# Patient Record
Sex: Male | Born: 1975 | Race: White | Hispanic: No | Smoking: Never smoker
Health system: Southern US, Community
[De-identification: ages and names within clinical notes are randomized; demographics above are authoritative.]

## PROBLEM LIST (undated history)

## (undated) DIAGNOSIS — K219 Gastro-esophageal reflux disease without esophagitis: Secondary | ICD-10-CM

## (undated) DIAGNOSIS — F909 Attention-deficit hyperactivity disorder, unspecified type: Secondary | ICD-10-CM

## (undated) HISTORY — DX: Attention-deficit hyperactivity disorder, unspecified type: F90.9

## (undated) HISTORY — PX: KNEE SURGERY: SHX244

---

## 2010-06-09 ENCOUNTER — Encounter: Admission: RE | Admit: 2010-06-09 | Discharge: 2010-06-09 | Payer: Self-pay | Admitting: Sports Medicine

## 2010-07-14 ENCOUNTER — Encounter: Admission: RE | Admit: 2010-07-14 | Discharge: 2010-07-14 | Payer: Self-pay | Admitting: Sports Medicine

## 2012-07-23 ENCOUNTER — Encounter (HOSPITAL_COMMUNITY): Payer: Self-pay

## 2012-07-23 ENCOUNTER — Emergency Department (HOSPITAL_COMMUNITY)
Admission: EM | Admit: 2012-07-23 | Discharge: 2012-07-23 | Disposition: A | Discharge status: Home / Self Care | Payer: BC Managed Care – PPO | Attending: Emergency Medicine | Admitting: Emergency Medicine

## 2012-07-23 ENCOUNTER — Emergency Department (HOSPITAL_COMMUNITY): Payer: BC Managed Care – PPO

## 2012-07-23 DIAGNOSIS — R079 Chest pain, unspecified: Secondary | ICD-10-CM | POA: Insufficient documentation

## 2012-07-23 DIAGNOSIS — K219 Gastro-esophageal reflux disease without esophagitis: Secondary | ICD-10-CM | POA: Insufficient documentation

## 2012-07-23 HISTORY — DX: Gastro-esophageal reflux disease without esophagitis: K21.9

## 2012-07-23 LAB — CBC
Hemoglobin: 15.2 g/dL (ref 13.0–17.0)
MCH: 30.7 pg (ref 26.0–34.0)
MCV: 89.3 fL (ref 78.0–100.0)
RBC: 4.95 MIL/uL (ref 4.22–5.81)

## 2012-07-23 LAB — BASIC METABOLIC PANEL
CO2: 26 mEq/L (ref 19–32)
Calcium: 9.6 mg/dL (ref 8.4–10.5)
Glucose, Bld: 106 mg/dL — ABNORMAL HIGH (ref 70–99)
Sodium: 137 mEq/L (ref 135–145)

## 2012-07-23 MED ORDER — NAPROXEN 500 MG PO TABS: 500.0000 mg | ORAL_TABLET | Freq: Two times a day (BID) | ORAL | Status: DC

## 2012-07-23 MED ORDER — CYCLOBENZAPRINE HCL 10 MG PO TABS: 10.0000 mg | ORAL_TABLET | Freq: Two times a day (BID) | ORAL | Status: DC | PRN

## 2012-07-23 NOTE — ED Provider Notes (Signed)
History     CSN: [id number]  Arrival date & time 07/23/12  0940   First MD Initiated Contact with Patient 07/23/12 [id number]      Chief Complaint  Patient presents with  . Chest Pain    (Consider location/radiation/quality/duration/timing/severity/associated sxs/prior treatment) HPI.... aching sensation left anterior chest since 8:30 this morning with no associated dyspnea, diaphoresis, nausea. Symptoms were minimal. Does not smoke cigarettes. No family history of heart disease. No chronic medical conditions other than GERD. Nothing makes symptoms better or worse.   Past Medical History  Diagnosis Date  . Acid reflux     History reviewed. No pertinent past surgical history.  History reviewed. No pertinent family history.  History  Substance Use Topics  . Smoking status: Never Smoker   . Smokeless tobacco: Not on file  . Alcohol Use: Yes      Review of Systems  All other systems reviewed and are negative.    Allergies  Review of patient's allergies indicates no known allergies.  Home Medications   Current Outpatient Rx  Name Route Sig Dispense Refill  . ASPIRIN 325 MG PO TABS Oral Take 650 mg by mouth once. For headache    . OMEPRAZOLE MAGNESIUM 20 MG PO TBEC Oral Take 20 mg by mouth daily.      BP 118/80  Pulse 76  Temp 97.7 F (36.5 C) (Oral)  Resp 14  SpO2 98%  Physical Exam  Nursing note and vitals reviewed. Constitutional: He is oriented to person, place, and time. He appears well-developed and well-nourished.  HENT:  Head: Normocephalic and atraumatic.  Eyes: Conjunctivae normal and EOM are normal. Pupils are equal, round, and reactive to light.  Neck: Normal range of motion. Neck supple.  Cardiovascular: Normal rate, regular rhythm and normal heart sounds.   Pulmonary/Chest: Effort normal and breath sounds normal.  Abdominal: Soft. Bowel sounds are normal.  Musculoskeletal: Normal range of motion.  Neurological: He is alert and oriented to  person, place, and time.  Skin: Skin is warm and dry.  Psychiatric: He has a normal mood and affect.    ED Course  Procedures (including critical care time)  Labs Reviewed  BASIC METABOLIC PANEL - Abnormal; Notable for the following:    Glucose, Bld 106 (*)     All other components within normal limits  CBC  TROPONIN I   Dg Chest 2 View  07/23/2012  *RADIOLOGY REPORT*  Clinical Data: Left upper anterior chest pain  CHEST - 2 VIEW  Comparison: None  Findings: Normal heart size, mediastinal contours, and pulmonary vascularity. Lungs clear. No pleural effusion or pneumothorax. No acute osseous findings.  IMPRESSION: No acute abnormalities.   Original Report Authenticated By: Lollie Marrow, M.D.      No diagnosis found.   Date: 07/23/2012  Rate: 79  Rhythm: normal sinus rhythm  QRS Axis: right  Intervals: normal  ST/T Wave abnormalities: normal  Conduction Disutrbances:incomplete RBBB  Narrative Interpretation:   Old EKG Reviewed: none available   MDM  Extremely low risk for acute coronary syndrome or pulmonary embolus. History is vague. Screening test negative. Discharge home on Flexeril 10 mg #20 and Naprosyn 5 mg # 20. Followup [facility] cardiology         Donnetta Hutching, MD 07/23/12 1327

## 2012-07-23 NOTE — ED Notes (Signed)
Pt d/c home in NAD. Pt voiced understanding of d/c instructions and follow up care.  

## 2012-07-23 NOTE — ED Notes (Signed)
Pt gone to xray

## 2012-07-23 NOTE — ED Notes (Signed)
Pt complains of chest pain, dull in nature, started this am, non radiating

## 2012-07-26 ENCOUNTER — Encounter: Payer: Self-pay | Admitting: Cardiology

## 2012-07-26 ENCOUNTER — Ambulatory Visit (INDEPENDENT_AMBULATORY_CARE_PROVIDER_SITE_OTHER): Payer: BC Managed Care – PPO | Admitting: Cardiology

## 2012-07-26 VITALS — BP 136/95 | HR 73 | Ht 74.0 in | Wt 237.4 lb

## 2012-07-26 DIAGNOSIS — R072 Precordial pain: Secondary | ICD-10-CM | POA: Insufficient documentation

## 2012-07-26 NOTE — Patient Instructions (Addendum)
Follow up as needed

## 2012-07-26 NOTE — Progress Notes (Signed)
HPI The patient presents for evaluation of chest discomfort. He was seen in the emergency room recently. He said he was sitting at work when he developed some discomfort that was midsternal and left of sternum. It felt like a "sprain". It was mild and lasted for about an hour. He did not describe radiation to his neck or to his arms. He did not describe associated nausea vomiting or diaphoresis. He was not short of breath and had no PND or orthopnea. He presented to the emergency room where the EKG was nonacute though there was a right axis deviation and RSR prime in V1. It was felt possibly to be musculoskeletal and he was told however to followup here. Of note he does have reflux but he has had none of those symptoms recently.  No Known Allergies  Current Outpatient Prescriptions  Medication Sig Dispense Refill  . aspirin 325 MG tablet Take 650 mg by mouth once. For headache      . omeprazole (PRILOSEC OTC) 20 MG tablet Take 20 mg by mouth daily.        Past Medical History  Diagnosis Date  . Acid reflux     PSH:  None  Family History:  The patient has no family history of early coronary disease. However, his brother did have some kind of apparent electrophysiology problem treated as a child.  History   Social History  . Marital Status: Married    Spouse Name: N/A    Number of Children: N/A  . Years of Education: N/A   Occupational History  . Not on file.   Social History Main Topics  . Smoking status: Never Smoker   . Smokeless tobacco: Not on file  . Alcohol Use: Yes  . Drug Use: No  . Sexually Active:    Other Topics Concern  . Not on file   Social History Narrative  . No narrative on file    ROS:  As stated in the HPI and negative for all other systems.   PHYSICAL EXAM BP 136/95  Pulse 73  Ht 6\' 2"  (1.88 m)  Wt 237 lb 6.4 oz (107.684 kg)  BMI 30.48 kg/m2 GENERAL:  Well appearing HEENT:  Pupils equal round and reactive, fundi not visualized, oral mucosa  unremarkable NECK:  No jugular venous distention, waveform within normal limits, carotid upstroke brisk and symmetric, no bruits, no thyromegaly LYMPHATICS:  No cervical, inguinal adenopathy LUNGS:  Clear to auscultation bilaterally BACK:  No CVA tenderness CHEST:  Unremarkable HEART:  PMI not displaced or sustained,S1 and S2 within normal limits, no S3, no S4, no clicks, no rubs, no murmurs ABD:  Flat, positive bowel sounds normal in frequency in pitch, no bruits, no rebound, no guarding, no midline pulsatile mass, no hepatomegaly, no splenomegaly EXT:  2 plus pulses throughout, no edema, no cyanosis no clubbing SKIN:  No rashes no nodules NEURO:  Cranial nerves II through XII grossly intact, motor grossly intact throughout PSYCH:  Cognitively intact, oriented to person place and time  EKG:  Sinus rhythm, rate 79, sinus arrhythmia, rightward axis, RSR prime V1 no acute ST-T wave changes.  ASSESSMENT AND PLAN  Chest pain - His chest pain is atypical. He has no risk factors and a normal exam. The pretest probability of obstructive coronary disease is extremely low. No further evaluation from a cardiovascular standpoint is warranted.  Overweight - The patient's body mass index with him in the obese range though this is clearly an exaggeration as he  is fit. We had a long discussion about exercise fitness and weight goals.

## 2014-03-21 ENCOUNTER — Ambulatory Visit (INDEPENDENT_AMBULATORY_CARE_PROVIDER_SITE_OTHER): Payer: BC Managed Care – PPO | Admitting: Emergency Medicine

## 2014-03-21 ENCOUNTER — Ambulatory Visit (INDEPENDENT_AMBULATORY_CARE_PROVIDER_SITE_OTHER): Payer: BC Managed Care – PPO

## 2014-03-21 VITALS — BP 132/82 | HR 68 | Temp 98.4°F | Resp 20 | Ht 74.5 in | Wt 235.0 lb

## 2014-03-21 DIAGNOSIS — R109 Unspecified abdominal pain: Secondary | ICD-10-CM

## 2014-03-21 DIAGNOSIS — K5900 Constipation, unspecified: Secondary | ICD-10-CM

## 2014-03-21 LAB — POCT CBC
GRANULOCYTE PERCENT: 58 % (ref 37–80)
HEMATOCRIT: 46.2 % (ref 43.5–53.7)
HEMOGLOBIN: 15.3 g/dL (ref 14.1–18.1)
LYMPH, POC: 2.1 (ref 0.6–3.4)
MCH, POC: 30.7 pg (ref 27–31.2)
MCHC: 33.1 g/dL (ref 31.8–35.4)
MCV: 92.6 fL (ref 80–97)
MID (cbc): 0.5 (ref 0–0.9)
MPV: 9.7 fL (ref 0–99.8)
POC GRANULOCYTE: 3.5 (ref 2–6.9)
POC LYMPH PERCENT: 33.8 %L (ref 10–50)
POC MID %: 8.2 %M (ref 0–12)
Platelet Count, POC: 303 10*3/uL (ref 142–424)
RBC: 4.99 M/uL (ref 4.69–6.13)
RDW, POC: 13.4 %
WBC: 6.1 10*3/uL (ref 4.6–10.2)

## 2014-03-21 LAB — POCT URINALYSIS DIPSTICK
BILIRUBIN UA: NEGATIVE
Blood, UA: NEGATIVE
Glucose, UA: NEGATIVE
KETONES UA: NEGATIVE
LEUKOCYTES UA: NEGATIVE
Nitrite, UA: NEGATIVE
Protein, UA: NEGATIVE
SPEC GRAV UA: 1.015
Urobilinogen, UA: 0.2
pH, UA: 5.5

## 2014-03-21 MED ORDER — DICYCLOMINE HCL 20 MG PO TABS: 20.0000 mg | ORAL_TABLET | Freq: Three times a day (TID) | ORAL | Status: DC

## 2014-03-21 NOTE — Patient Instructions (Signed)
Constipation  Constipation is when a person has fewer than three bowel movements a week, has difficulty having a bowel movement, or has stools that are dry, hard, or larger than normal. As people grow older, constipation is more common. If you try to fix constipation with medicines that make you have a bowel movement (laxatives), the problem may get worse. Long-term laxative use may cause the muscles of the colon to become weak. A low-fiber diet, not taking in enough fluids, and taking certain medicines may make constipation worse.   CAUSES   · Certain medicines, such as antidepressants, pain medicine, iron supplements, antacids, and water pills.    · Certain diseases, such as diabetes, irritable bowel syndrome (IBS), thyroid disease, or depression.    · Not drinking enough water.    · Not eating enough fiber-rich foods.    · Stress or travel.    · Lack of physical activity or exercise.    · Ignoring the urge to have a bowel movement.    · Using laxatives too much.    SIGNS AND SYMPTOMS   · Having fewer than three bowel movements a week.    · Straining to have a bowel movement.    · Having stools that are hard, dry, or larger than normal.    · Feeling full or bloated.    · Pain in the lower abdomen.    · Not feeling relief after having a bowel movement.    DIAGNOSIS   Your health care Christean Silvestri will take a medical history and perform a physical exam. Further testing may be done for severe constipation. Some tests may include:  · A barium enema X-ray to examine your rectum, colon, and, sometimes, your small intestine.    · A sigmoidoscopy to examine your lower colon.    · A colonoscopy to examine your entire colon.  TREATMENT   Treatment will depend on the severity of your constipation and what is causing it. Some dietary treatments include drinking more fluids and eating more fiber-rich foods. Lifestyle treatments may include regular exercise. If these diet and lifestyle recommendations do not help, your health care  Llana Deshazo may recommend taking over-the-counter laxative medicines to help you have bowel movements. Prescription medicines may be prescribed if over-the-counter medicines do not work.   HOME CARE INSTRUCTIONS   · Eat foods that have a lot of fiber, such as fruits, vegetables, whole grains, and beans.  · Limit foods high in fat and processed sugars, such as french fries, hamburgers, cookies, candies, and soda.    · A fiber supplement may be added to your diet if you cannot get enough fiber from foods.    · Drink enough fluids to keep your urine clear or pale yellow.    · Exercise regularly or as directed by your health care Kateryna Grantham.    · Go to the restroom when you have the urge to go. Do not hold it.    · Only take over-the-counter or prescription medicines as directed by your health care Neria Procter. Do not take other medicines for constipation without talking to your health care Nikyah Lackman first.    SEEK IMMEDIATE MEDICAL CARE IF:   · You have bright red blood in your stool.    · Your constipation lasts for more than 4 days or gets worse.    · You have abdominal or rectal pain.    · You have thin, pencil-like stools.    · You have unexplained weight loss.  MAKE SURE YOU:   · Understand these instructions.  · Will watch your condition.  · Will get help right away if you are not   doing well or get worse.  Document Released: 06/17/2004 Document Revised: 09/24/2013 Document Reviewed: 07/01/2013  ExitCare® Patient Information ©2015 [employer]. This information is not intended to replace advice given to you by your health care Arby Dahir. Make sure you discuss any questions you have with your health care Amani Nodarse.

## 2014-03-21 NOTE — Progress Notes (Addendum)
This chart was scribed for Russell Chase , MD by Russell Chase, ED Scribe. This patient was seen in room Room/bed 2 and the patient's care was started at 1:09 PM. Subjective:    Patient ID: Russell Chase, male    DOB: 04/21/1976, 38 y.o.   MRN: [id number] Chief Complaint  Patient presents with  . Abdominal Pain  . Back Pain   Abdominal Pain Pertinent negatives include no fever, nausea or vomiting.  Back Pain Associated symptoms include abdominal pain. Pertinent negatives include no fever.   [name] is a 38 y.o. male who [facility] of ongoing generalized abdominal and back pain that began 1 week ago. Pt states his abdominal pain begins in one area and switches to the opposite side; states he is having pain that generally resolves on its own but returns upon eating.States upon eating, he feels his food going down and moving side-to-side; once the food is out, states he has relief. States he is trying to loose weight but denies any unexpected weight changes. Reports having normal BM every 2 days and states he generally has BM daily. Had a similar episode March 2014 and was told he was constipated; he reports taking Murilax with minimal relief. Family hx, grandfather, was diagnosed with colon cancer at 7280 and brother had bowel obstruction when he was younger. Denies fever, nausea, emesis, hx of smoking, and hx of diverticulitis.  He also c/o lower back pain. States he has frequent episodes of lower back pain. Does repetitive lifting while at work. Denies any recent injuries and fall.   Reports having bruise to L lower eye from minor injury while at work. States he had a plastic pole hit lower L eye and has now caused bruising.  Patient Active Problem List   Diagnosis Date Noted  . Precordial pain 07/26/2012   Current outpatient prescriptions:aspirin 325 MG tablet, Take 650 mg by mouth once. For headache, Disp: , Rfl: ;  omeprazole (PRILOSEC OTC)  20 MG tablet, Take 20 mg by mouth daily., Disp: , Rfl:   Review of Systems  Constitutional: Negative for fever.  Gastrointestinal: Positive for abdominal pain. Negative for nausea, vomiting and blood in stool.  Musculoskeletal: Positive for back pain.   Objective:   Physical Exam General: Well-developed, well-nourished male in no acute distress; appearance consistent with age of record HENT: normocephalic; atraumatic Eyes: pupils equal, round and reactive to light; extraocular muscles intact. Bruise below the L eye. Neck: supple Heart: regular rate and rhythm; no murmurs, rubs or gallops Lungs: clear to auscultation bilaterally Abdomen: soft; nondistended;no masses or hepatosplenomegaly; bowel sounds present. Tenderness to palpation to L lower abdomen. No rebound.  Extremities: No deformity; full range of motion; pulses normal Neurologic: Awake, alert and oriented; motor function intact in all extremities and symmetric; no facial droop Skin: Warm and dry Psychiatric: Normal mood and affect Back  Deep tendon reflexes are 2+ and symmetrical motor strength 5 out of 5 Triage Vitals:BP 132/82  Pulse 68  Temp(Src) 98.4 F (36.9 C) (Oral)  Resp 20  Ht 6' 2.5" (1.892 m)  Wt 235 lb (106.595 kg)  BMI 29.78 kg/m2  SpO2 99%  [facility] preliminary x-ray report read by Dr.Daub this shows a significant stool burden. There is no obstruction no free air  Results for orders placed in visit on 03/21/14  POCT CBC      Result Value Ref Range   WBC 6.1  4.6 - 10.2 K/uL   Lymph,  poc 2.1  0.6 - 3.4   POC LYMPH PERCENT 33.8  10 - 50 %L   MID (cbc) 0.5  0 - 0.9   POC MID % 8.2  0 - 12 %M   POC Granulocyte 3.5  2 - 6.9   Granulocyte percent 58.0  37 - 80 %G   RBC 4.99  4.69 - 6.13 M/uL   Hemoglobin 15.3  14.1 - 18.1 g/dL   HCT, POC 30.846.2  65.743.5 - 53.7 %   MCV 92.6  80 - 97 fL   MCH, POC 30.7  27 - 31.2 pg   MCHC 33.1  31.8 - 35.4 g/dL   RDW, POC 84.613.4     Platelet Count, POC 303  142 - 424 K/uL   MPV  9.7  0 - 99.8 fL  POCT URINALYSIS DIPSTICK      Result Value Ref Range   Color, UA yellow     Clarity, UA color     Glucose, UA neg     Bilirubin, UA neg     Ketones, UA neg     Spec Grav, UA 1.015     Blood, UA neg     pH, UA 5.5     Protein, UA neg     Urobilinogen, UA 0.2     Nitrite, UA neg     Leukocytes, UA Negative     Assessment & Plan:  I suspect his symptoms are secondary to constipation we'll make referral to GI for evaluation advised him to use MiraLax on a daily basis. I gave him some Bentyl to take for pain.  I personally performed the services described in this documentation, which was scribed in my presence. The recorded information has been reviewed and is accurate.

## 2014-03-22 ENCOUNTER — Other Ambulatory Visit: Payer: Self-pay | Admitting: Emergency Medicine

## 2014-03-22 DIAGNOSIS — R1032 Left lower quadrant pain: Secondary | ICD-10-CM

## 2014-03-22 LAB — COMPREHENSIVE METABOLIC PANEL
ALBUMIN: 4.7 g/dL (ref 3.5–5.2)
ALT: 29 U/L (ref 0–53)
AST: 19 U/L (ref 0–37)
Alkaline Phosphatase: 84 U/L (ref 39–117)
BUN: 19 mg/dL (ref 6–23)
CALCIUM: 9.8 mg/dL (ref 8.4–10.5)
CHLORIDE: 105 meq/L (ref 96–112)
CO2: 23 meq/L (ref 19–32)
Creat: 0.95 mg/dL (ref 0.50–1.35)
Glucose, Bld: 88 mg/dL (ref 70–99)
POTASSIUM: 4 meq/L (ref 3.5–5.3)
SODIUM: 141 meq/L (ref 135–145)
TOTAL PROTEIN: 7.9 g/dL (ref 6.0–8.3)
Total Bilirubin: 0.7 mg/dL (ref 0.2–1.2)

## 2014-03-27 ENCOUNTER — Encounter: Payer: Self-pay | Admitting: Internal Medicine

## 2014-03-27 ENCOUNTER — Ambulatory Visit
Admission: RE | Admit: 2014-03-27 | Discharge: 2014-03-27 | Disposition: A | Discharge status: Home / Self Care | Payer: BC Managed Care – PPO | Source: Ambulatory Visit | Attending: Emergency Medicine | Admitting: Emergency Medicine

## 2014-03-27 DIAGNOSIS — R1032 Left lower quadrant pain: Secondary | ICD-10-CM

## 2014-03-27 MED ORDER — IOHEXOL 300 MG/ML  SOLN
125.0000 mL | Freq: Once | INTRAMUSCULAR | Status: AC | PRN
  Administered 2014-03-27: 125 mL via INTRAVENOUS

## 2014-06-03 ENCOUNTER — Ambulatory Visit: Payer: BC Managed Care – PPO | Admitting: Internal Medicine

## 2014-06-03 ENCOUNTER — Telehealth: Payer: Self-pay | Admitting: Internal Medicine

## 2014-06-13 NOTE — Telephone Encounter (Signed)
Pt late because of emergency.  No charge.

## 2014-08-05 ENCOUNTER — Encounter: Payer: Self-pay | Admitting: Internal Medicine

## 2014-08-05 ENCOUNTER — Ambulatory Visit (INDEPENDENT_AMBULATORY_CARE_PROVIDER_SITE_OTHER): Payer: BC Managed Care – PPO | Admitting: Internal Medicine

## 2014-08-05 VITALS — BP 138/90 | HR 86 | Ht 74.0 in | Wt 232.0 lb

## 2014-08-05 DIAGNOSIS — R1084 Generalized abdominal pain: Secondary | ICD-10-CM

## 2014-08-05 DIAGNOSIS — R194 Change in bowel habit: Secondary | ICD-10-CM

## 2014-08-05 DIAGNOSIS — K5909 Other constipation: Secondary | ICD-10-CM

## 2014-08-05 DIAGNOSIS — K219 Gastro-esophageal reflux disease without esophagitis: Secondary | ICD-10-CM

## 2014-08-05 MED ORDER — MOVIPREP 100 G PO SOLR
1.0000 | Freq: Once | ORAL | Status: DC
Start: 2014-08-05 — End: 2014-09-22

## 2014-08-05 MED ORDER — OMEPRAZOLE 20 MG PO CPDR: 20.0000 mg | DELAYED_RELEASE_CAPSULE | Freq: Every day | ORAL | Status: DC

## 2014-08-05 NOTE — Patient Instructions (Signed)
We have sent the following medications to your pharmacy for you to pick up at your convenience:  Omeprazole  You may use Miralax, 1 scoop a day  You have been scheduled for an endoscopy and colonoscopy. Please follow the written instructions given to you at your visit today. Please pick up your prep at the pharmacy within the next 1-3 days. If you use inhalers (even only as needed), please bring them with you on the day of your procedure. Your physician has requested that you go to www.startemmi.com and enter the access code given to you at your visit today. This web site gives a general overview about your procedure. However, you should still follow specific instructions given to you by our office regarding your preparation for the procedure.

## 2014-08-05 NOTE — Progress Notes (Signed)
HISTORY OF PRESENT ILLNESS:  Russell Chase is a 38 y.o. male with no significant past medical history who was sent by urgent medical and family care, Dr. Cleta Albertsaub, regarding abdominal pain. Patient was evaluated 03/18/2014 (reviewed) with a two-month history of abdominal discomfort. Workup included CBC with differential and urinalysis. These were unremarkable. He subsequently underwent contrast-enhanced CT scan of the abdomen and pelvis 03/27/2014. This was unremarkable. The patient's symptoms have persisted. He describes dull discomfort in the left mid abdomen with radiation across the midabdomen most often improved post defecation. He does describe change in his bowel habits over the past 6 months. Previously, once daily bowel movement. Now, may go 3 days without a bowel movement. He denies new medications, change in diet, or change in his environment. He also describes abdominal discomfort worse after meals. There has been no weight loss. He does have chronic GERD with pyrosis several times per week. Food avoidance associated with this. No dysphagia. He has had this for years. He has used antacids and OTC PPI sporadically with transient improvement. He denies fevers or rectal bleeding. Paternal grandfather possibly with colon cancer versus prostate cancer.   REVIEW OF SYSTEMS:  All non-GI ROS negative except for occasional joint aches.  Past Medical History  Diagnosis Date  . Acid reflux     Past Surgical History  Procedure Laterality Date  . Knee surgery      Right  . Knee surgery      Social History Russell HartiganClifford H Kirschenmann Chase  reports that he has never smoked. He does not have any smokeless tobacco history on file. He reports that he drinks alcohol. He reports that he does not use illicit drugs.  family history is not on file.  No Known Allergies     PHYSICAL EXAMINATION: Vital signs: BP 138/90 mmHg  Pulse 86  Ht 6\' 2"  (1.88 m)  Wt 232 lb (105.235 kg)  BMI 29.77 kg/m2  SpO2  98%  Constitutional: generally well-appearing, no acute distress Psychiatric: alert and oriented x3, cooperative Eyes: extraocular movements intact, anicteric, conjunctiva pink Mouth: oral pharynx moist, no lesions Neck: supple no lymphadenopathy Cardiovascular: heart regular rate and rhythm, no murmur Lungs: clear to auscultation bilaterally Abdomen: soft, nontender, nondistended, no obvious ascites, no peritoneal signs, normal bowel sounds, no organomegaly Rectal:deferred until colonoscopy Extremities: no lower extremity edema bilaterally Skin: no lesions on visible extremities Neuro: No focal deficits.   ASSESSMENT:  #1. Chronic abdominal pain as described. Associated with change in bowel habits. Rule out obstructive lesion. Negative CT reassuring, somewhat. #2. Change in bowel habits. Specifically, tendency toward constipation. It is possible that functional constipation is the etiology of his abdominal distress. #3. Chronic GERD. Rule out esophagitis, Barrett's.   PLAN:  #1. Reflux precautions  #2. Prescribe omeprazole 20 mg daily #3. Schedule upper endoscopy to evaluate chronic GERD and postprandial abdominal discomfort.The nature of the procedure, as well as the risks, benefits, and alternatives were carefully and thoroughly reviewed with the patient. Ample time for discussion and questions allowed. The patient understood, was satisfied, and agreed to proceed. #4. Schedule colonoscopy to evaluate abdominal pain and change in bowel habits.The nature of the procedure, as well as the risks, benefits, and alternatives were carefully and thoroughly reviewed with the patient. Ample time for discussion and questions allowed. The patient understood, was satisfied, and agreed to proceed. #5. In the interim,empiric Trial of MiraLAX once daily recommended. May take up OTC

## 2014-09-22 ENCOUNTER — Ambulatory Visit (AMBULATORY_SURGERY_CENTER): Payer: BC Managed Care – PPO | Admitting: Internal Medicine

## 2014-09-22 ENCOUNTER — Encounter: Payer: Self-pay | Admitting: Internal Medicine

## 2014-09-22 VITALS — BP 132/81 | HR 61 | Temp 97.4°F | Resp 19 | Ht 74.0 in | Wt 232.0 lb

## 2014-09-22 DIAGNOSIS — K219 Gastro-esophageal reflux disease without esophagitis: Secondary | ICD-10-CM

## 2014-09-22 DIAGNOSIS — K5901 Slow transit constipation: Secondary | ICD-10-CM

## 2014-09-22 DIAGNOSIS — R194 Change in bowel habit: Secondary | ICD-10-CM

## 2014-09-22 DIAGNOSIS — R1032 Left lower quadrant pain: Secondary | ICD-10-CM

## 2014-09-22 MED ORDER — SODIUM CHLORIDE 0.9 % IV SOLN
500.0000 mL | INTRAVENOUS | Status: DC
Start: 2014-09-22 — End: 2014-09-22

## 2014-09-22 NOTE — Progress Notes (Signed)
CRNA aware of high blood pressure.

## 2014-09-22 NOTE — Op Note (Signed)
[facility] [address] [place], [id number]   ENDOSCOPY PROCEDURE REPORT  PATIENT: Russell Chase, Russell H  MR#: [id number] BIRTHDATE: 05/11/1976 , 38  yrs. old GENDER: male ENDOSCOPIST: Roxy CedarJohn N Perry Jr, MD REFERRED BY:  Lesle ChrisSteven Daub, M.D. PROCEDURE DATE:  09/22/2014 PROCEDURE:  EGD, diagnostic ASA CLASS:     Class I INDICATIONS:  history of esophageal reflux. MEDICATIONS: Monitored anesthesia care and Propofol 300 mg IV TOPICAL ANESTHETIC: none  DESCRIPTION OF PROCEDURE: After the risks benefits and alternatives of the procedure were thoroughly explained, informed consent was obtained.  The LB [id number] and LB [id number] endoscope was introduced through the mouth and advanced to the second portion of the duodenum , Without limitations.  The instrument was slowly withdrawn as the mucosa was fully examined.  EXAM: The esophagus and gastroesophageal junction were completely normal in appearance.  The stomach was entered and closely examined.The antrum, angularis, and lesser curvature were well visualized, including a retroflexed view of the cardia and fundus. The stomach wall was normally distensable.  The scope passed easily through the pylorus into the duodenum.  Retroflexed views revealed no abnormalities.     The scope was then withdrawn from the patient and the procedure completed.  COMPLICATIONS: There were no immediate complications.  ENDOSCOPIC IMPRESSION: 1. GERD 2. Normal EGD  RECOMMENDATIONS: 1.  Anti-reflux regimen to be followed 2.  Continue omeprazole to control acid reflux symptoms  REPEAT EXAM:  eSigned:  Roxy CedarJohn N Perry Jr, MD 09/22/2014 3:25 PM    VH:QIONGECC:Steven Cleta Albertsaub, MD and The Patient

## 2014-09-22 NOTE — Patient Instructions (Signed)
Discharge instructions given. Normal exams. Resume previous medications. YOU HAD AN ENDOSCOPIC PROCEDURE TODAY AT THE [facility]: Refer to the procedure report that was given to you for any specific questions about what was found during the examination.  If the procedure report does not answer your questions, please call your gastroenterologist to clarify.  If you requested that your care partner not be given the details of your procedure findings, then the procedure report has been included in a sealed envelope for you to review at your convenience later.  YOU SHOULD EXPECT: Some feelings of bloating in the abdomen. Passage of more gas than usual.  Walking can help get rid of the air that was put into your GI tract during the procedure and reduce the bloating. If you had a lower endoscopy (such as a colonoscopy or flexible sigmoidoscopy) you may notice spotting of blood in your stool or on the toilet paper. If you underwent a bowel prep for your procedure, then you may not have a normal bowel movement for a few days.  DIET: Your first meal following the procedure should be a light meal and then it is ok to progress to your normal diet.  A half-sandwich or bowl of soup is an example of a good first meal.  Heavy or fried foods are harder to digest and may make you feel nauseous or bloated.  Likewise meals heavy in dairy and vegetables can cause extra gas to form and this can also increase the bloating.  Drink plenty of fluids but you should avoid alcoholic beverages for 24 hours.  ACTIVITY: Your care partner should take you home directly after the procedure.  You should plan to take it easy, moving slowly for the rest of the day.  You can resume normal activity the day after the procedure however you should NOT DRIVE or use heavy machinery for 24 hours (because of the sedation medicines used during the test).    SYMPTOMS TO REPORT IMMEDIATELY: A gastroenterologist can be reached at any hour.   During normal business hours, 8:30 AM to 5:00 PM Monday through Friday, call [phone].  After hours and on weekends, please call the GI answering service at [phone] who will take a message and have the physician on call contact you.   Following lower endoscopy (colonoscopy or flexible sigmoidoscopy):  Excessive amounts of blood in the stool  Significant tenderness or worsening of abdominal pains  Swelling of the abdomen that is new, acute  Fever of 100F or higher  Following upper endoscopy (EGD)  Vomiting of blood or coffee ground material  New chest pain or pain under the shoulder blades  Painful or persistently difficult swallowing  New shortness of breath  Fever of 100F or higher  Black, tarry-looking stools  FOLLOW UP: If any biopsies were taken you will be contacted by phone or by letter within the next 1-3 weeks.  Call your gastroenterologist if you have not heard about the biopsies in 3 weeks.  Our staff will call the home number listed on your records the next business day following your procedure to check on you and address any questions or concerns that you may have at that time regarding the information given to you following your procedure. This is a courtesy call and so if there is no answer at the home number and we have not heard from you through the emergency physician on call, we will assume that you have returned to your regular daily activities  without incident.  SIGNATURES/CONFIDENTIALITY: You and/or your care partner have signed paperwork which will be entered into your electronic medical record.  These signatures attest to the fact that that the information above on your After Visit Summary has been reviewed and is understood.  Full responsibility of the confidentiality of this discharge information lies with you and/or your care-partner.

## 2014-09-22 NOTE — Progress Notes (Signed)
Report to PACU, RN, vss, BBS= Clear.  

## 2014-09-22 NOTE — Op Note (Signed)
[facility] [address] [place], [id number]   COLONOSCOPY PROCEDURE REPORT  PATIENT: Russell Chase, Russell H  MR#: [id number] BIRTHDATE: 1976/01/24 , 38  yrs. old GENDER: male ENDOSCOPIST: Roxy CedarJohn N Lois Ostrom Jr, MD REFERRED XB:JYNWGNBY:Steven Daub, M.D. PROCEDURE DATE:  09/22/2014 PROCEDURE:   Colonoscopy, diagnostic First Screening Colonoscopy - Avg.  risk and is 50 yrs.  old or older - No.  Prior Negative Screening - Now for repeat screening. N/A  History of Adenoma - Now for follow-up colonoscopy & has been > or = to 3 yrs.  N/A  Polyps Removed Today? No.  Recommend repeat exam, <10 yrs? No. ASA CLASS:   Class I INDICATIONS:abdominal pain in the lower left quadrant and change in bowel habits. MEDICATIONS: Monitored anesthesia care and Propofol 400 mg IV  DESCRIPTION OF PROCEDURE:   After the risks benefits and alternatives of the procedure were thoroughly explained, informed consent was obtained.  The digital rectal exam revealed no abnormalities of the rectum.   The LB [id number]  endoscope was introduced through the anus and advanced to the cecum, which was identified by both the appendix and ileocecal valve. No adverse events experienced.   The quality of the prep was excellent, using MoviPrep  The instrument was then slowly withdrawn as the colon was fully examined.   COLON FINDINGS: A normal appearing cecum, ileocecal valve, and appendiceal orifice were identified.  The ascending, transverse, descending, sigmoid colon, and rectum appeared unremarkable. Retroflexed views revealed no abnormalities. The time to cecum=2 minutes 43 seconds.  Withdrawal time=8 minutes 37 seconds.  The scope was withdrawn and the procedure completed.  COMPLICATIONS: There were no immediate complications.  ENDOSCOPIC IMPRESSION: 1. Normal colonoscopy  RECOMMENDATIONS: 1.  Continue current colorectal screening recommendations for "routine risk" patients with a repeat colonoscopy  age 38 2.  Continue MiraLAX if needed for constipation 3.  Upper endoscopy today (see report)  eSigned:  Roxy CedarJohn N Manas Hickling Jr, MD 09/22/2014 3:20 PM   cc:

## 2014-09-23 ENCOUNTER — Telehealth: Payer: Self-pay | Admitting: *Deleted

## 2014-09-23 NOTE — Telephone Encounter (Signed)
  Follow up Call-  Call back number 09/22/2014  Post procedure Call Back phone  # [phone]  Permission to leave phone message Yes     Patient questions:  Do you have a fever, pain , or abdominal swelling? No. Pain Score  0 *  Have you tolerated food without any problems? Yes.    Have you been able to return to your normal activities? Yes.    Do you have any questions about your discharge instructions: Diet   No. Medications  No. Follow up visit  No.  Do you have questions or concerns about your Care? No.  Actions: * If pain score is 4 or above: No action needed, pain <4.

## 2014-12-12 ENCOUNTER — Other Ambulatory Visit: Payer: Self-pay | Admitting: Internal Medicine

## 2014-12-12 NOTE — Telephone Encounter (Signed)
Refilled Omeprazole 

## 2015-07-13 ENCOUNTER — Other Ambulatory Visit: Payer: Self-pay | Admitting: Internal Medicine

## 2015-11-10 IMAGING — CT CT ABD-PELV W/ CM
2 of 4 series · 17 of 46 positions shown, 19 images · IV contrast (READICAT/WATER & [ID] OMNI 300)
Comparison: Abdomen films of 03/21/2014

CLINICAL DATA: Abdominal pain

EXAM:
CT ABDOMEN AND PELVIS WITH CONTRAST
TECHNIQUE: Multidetector CT imaging of the abdomen and pelvis was performed
using the standard protocol following bolus administration of
intravenous contrast.
CONTRAST:  125mL OMNIPAQUE IOHEXOL 300 MG/ML  SOLN

[Series 2: abd/pelvis with · axial · 0.78mm/px · z∈[-459,+1]mm · 14 of 102 slices shown, 16 images]
[im 5/102  soft-tissue]
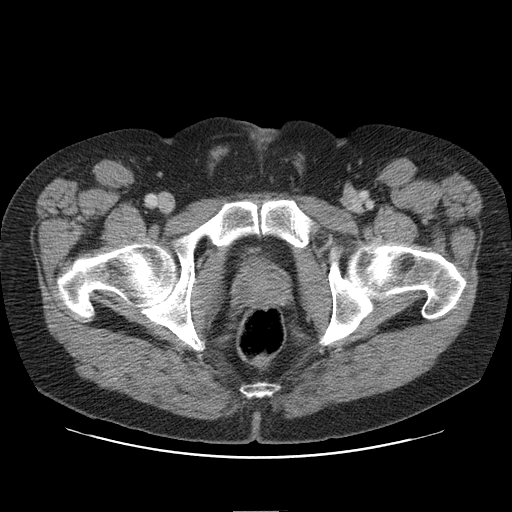
[im 5/102  bone]
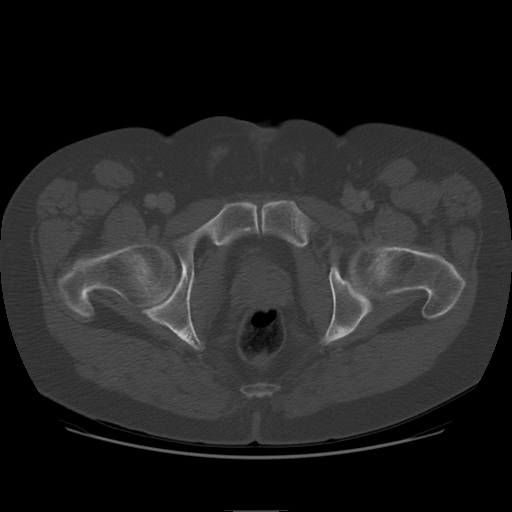
[im 13/102  soft-tissue]
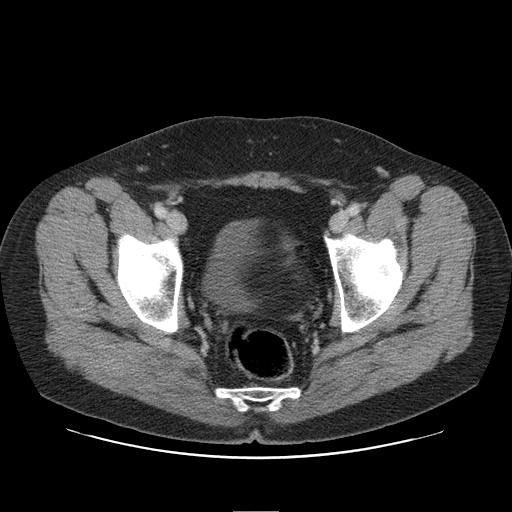
[im 22/102  soft-tissue]
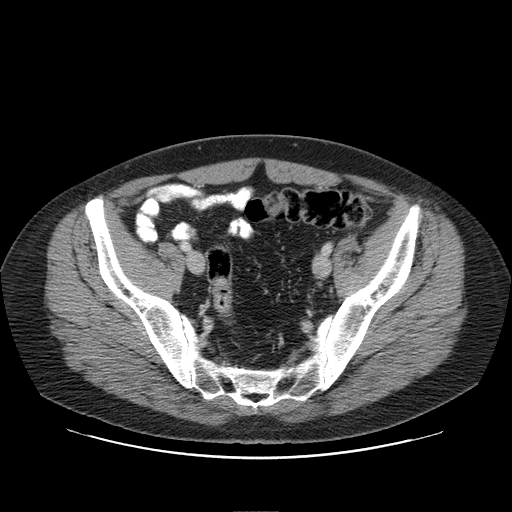
[im 26/102  soft-tissue]
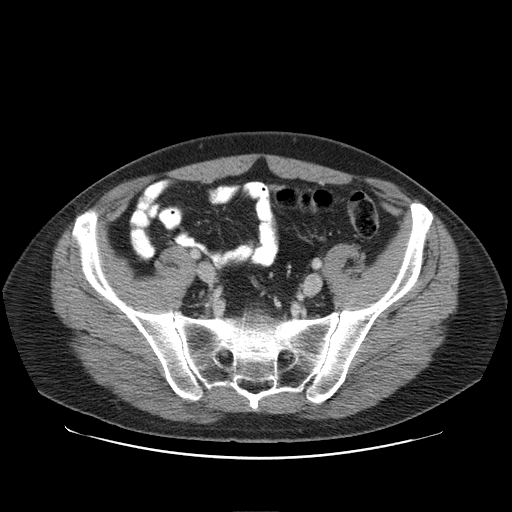
[im 34/102  soft-tissue]
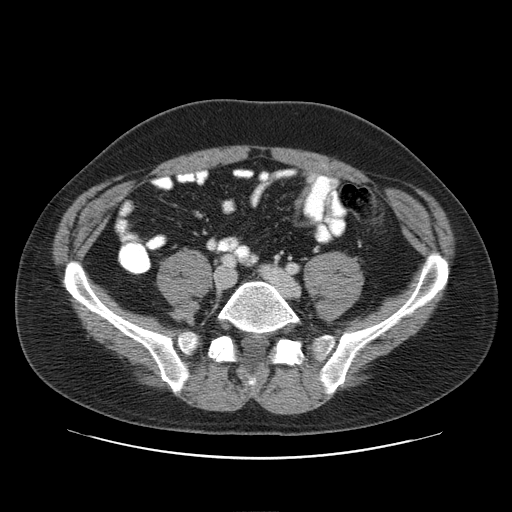
[im 43/102  soft-tissue]
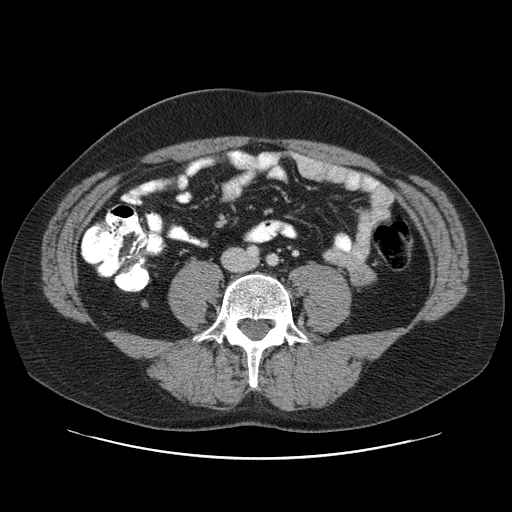
[im 47/102  soft-tissue]
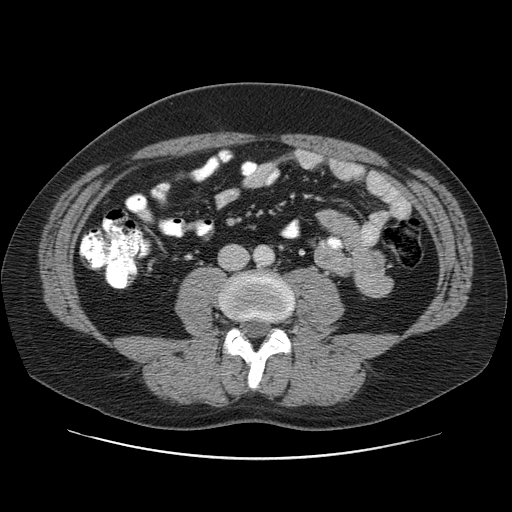
[im 55/102  soft-tissue]
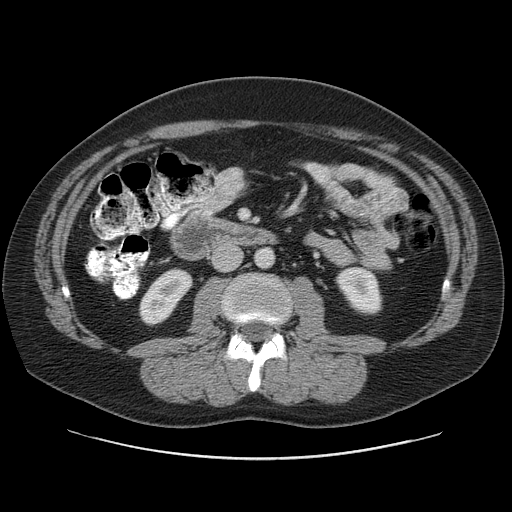
[im 59/102  soft-tissue]
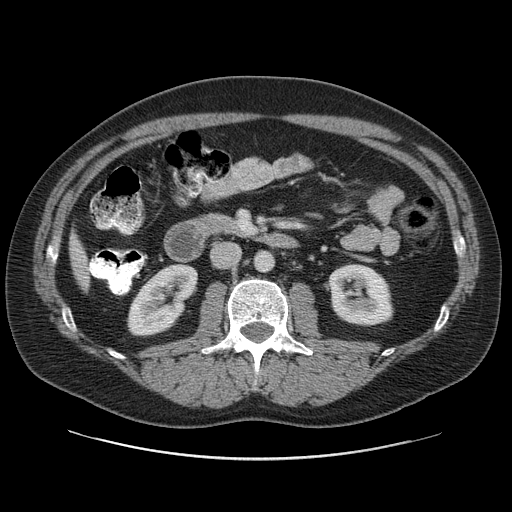
[im 59/102  bone]
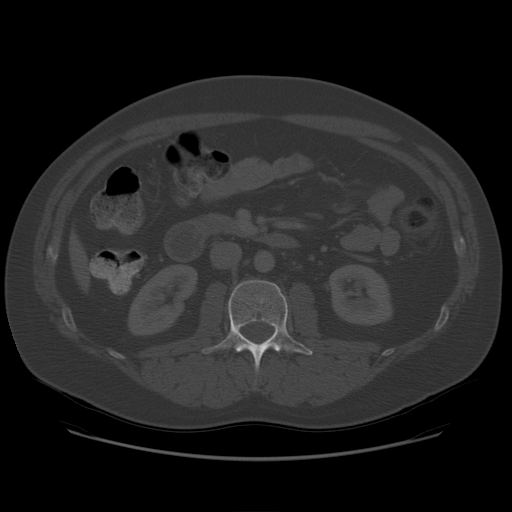
[im 68/102  soft-tissue]
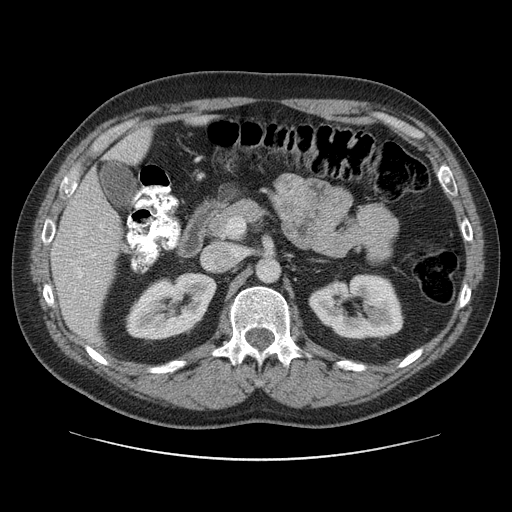
[im 76/102  soft-tissue]
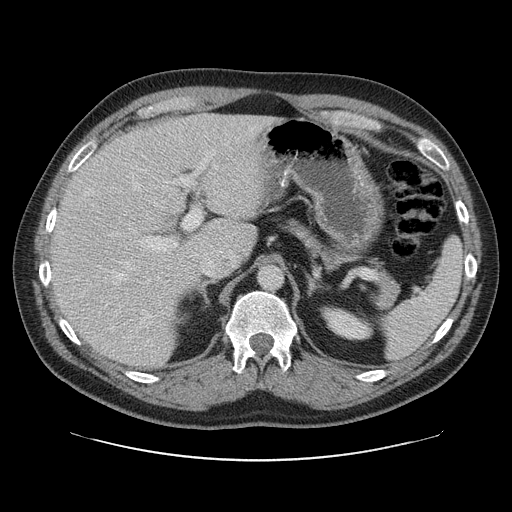
[im 80/102  soft-tissue]
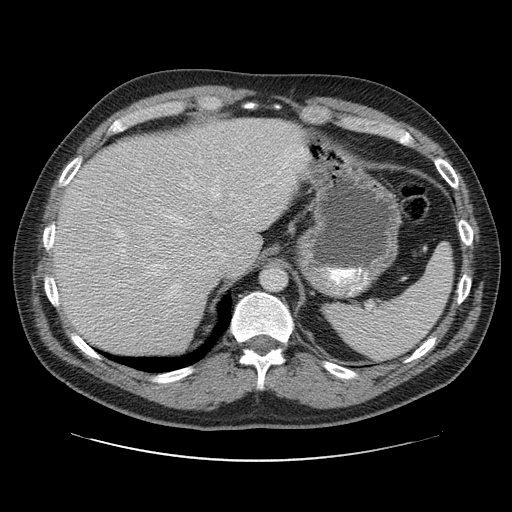
[im 89/102  soft-tissue]
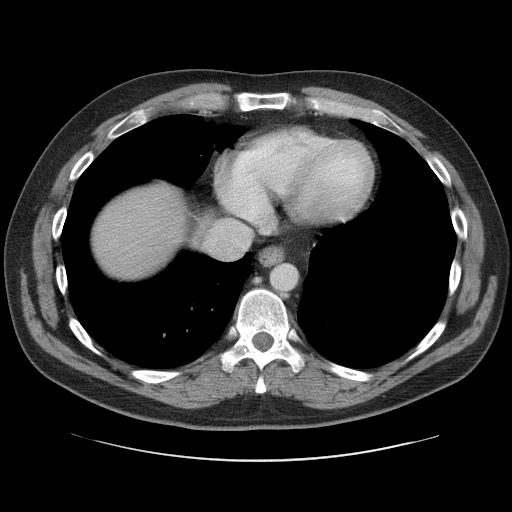
[im 97/102  soft-tissue]
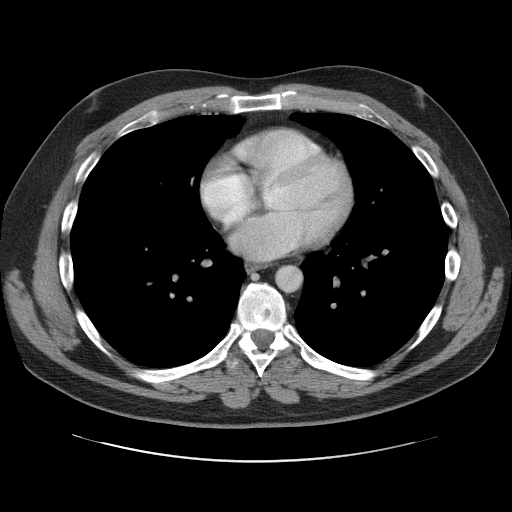

[Series 400: cor · coronal · 1.15mm/px · 3 of 139 slices shown]
[im 47/139  soft-tissue]
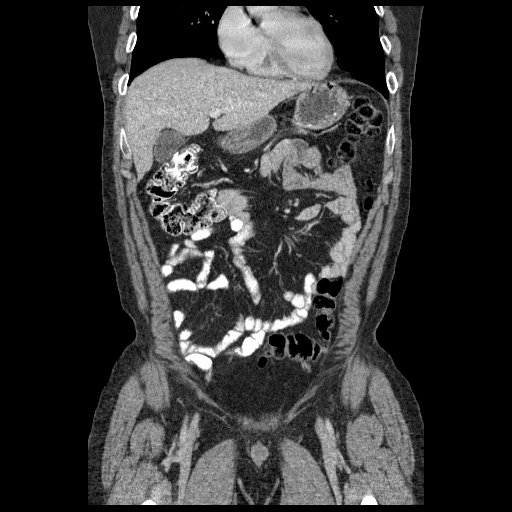
[im 62/139  soft-tissue]
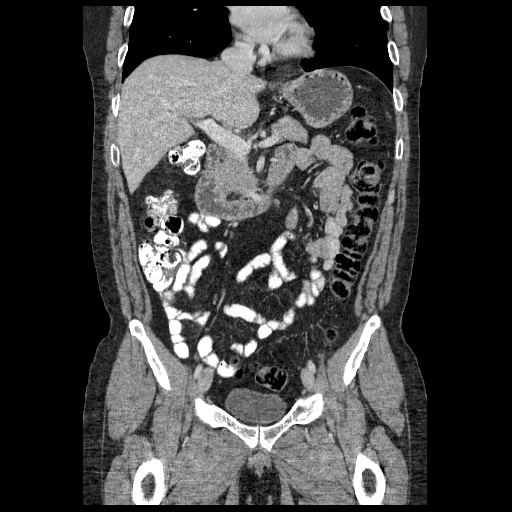
[im 77/139  soft-tissue]
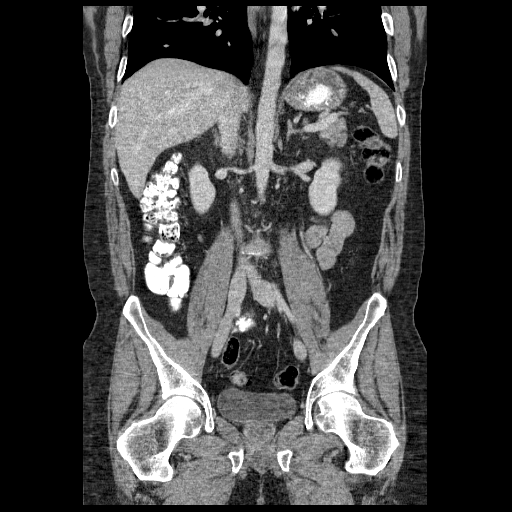

[17 of 46 positions shown; findings below may reference images not displayed]

FINDINGS: The lung bases are clear. The liver enhances with no focal
abnormality and no ductal dilatation is seen. No calcified
gallstones are noted. The pancreas is normal in size and the
pancreatic duct is not dilated. The adrenal glands and spleen are
unremarkable. The stomach is moderately fluid distended with no
abnormality noted. The kidneys enhance with no calculus or mass and
no hydronephrosis is seen. The abdominal aorta is normal in caliber.
No adenopathy is noted.

The urinary bladder is moderately urine distended with no
abnormality noted. No fluid is seen within the pelvis. The prostate
is normal in size. No abnormality of the colon is seen. The terminal
ileum is unremarkable. The appendix is well seen with no
inflammatory process noted. A few small lymph nodes are present
within the right lower quadrant of doubtful significance. The lumbar
vertebrae are in normal alignment with normal disc spaces.
IMPRESSION: 1. No explanation for the patient's abdominal pain is seen.
2. No hydronephrosis.  No renal calculi.
3. The appendix and terminal ileum are unremarkable.

## 2015-12-26 ENCOUNTER — Other Ambulatory Visit: Payer: Self-pay | Admitting: Internal Medicine

## 2021-12-01 ENCOUNTER — Encounter: Payer: Self-pay | Admitting: Nurse Practitioner

## 2021-12-24 ENCOUNTER — Encounter: Payer: Self-pay | Admitting: Nurse Practitioner

## 2021-12-24 ENCOUNTER — Other Ambulatory Visit: Payer: Self-pay

## 2021-12-24 ENCOUNTER — Ambulatory Visit (INDEPENDENT_AMBULATORY_CARE_PROVIDER_SITE_OTHER): Payer: 59 | Admitting: Nurse Practitioner

## 2021-12-24 VITALS — BP 112/82 | HR 95 | Temp 98.0°F | Ht 74.0 in | Wt 215.2 lb

## 2021-12-24 DIAGNOSIS — Z13220 Encounter for screening for lipoid disorders: Secondary | ICD-10-CM | POA: Diagnosis not present

## 2021-12-24 DIAGNOSIS — E663 Overweight: Secondary | ICD-10-CM

## 2021-12-24 DIAGNOSIS — Z1159 Encounter for screening for other viral diseases: Secondary | ICD-10-CM | POA: Diagnosis not present

## 2021-12-24 DIAGNOSIS — Z131 Encounter for screening for diabetes mellitus: Secondary | ICD-10-CM | POA: Diagnosis not present

## 2021-12-24 LAB — CBC
HCT: 44.4 % (ref 39.0–52.0)
Hemoglobin: 14.9 g/dL (ref 13.0–17.0)
MCHC: 33.5 g/dL (ref 30.0–36.0)
MCV: 94.6 fl (ref 78.0–100.0)
Platelets: 265 10*3/uL (ref 150.0–400.0)
RBC: 4.69 Mil/uL (ref 4.22–5.81)
RDW: 13.4 % (ref 11.5–15.5)
WBC: 4.4 10*3/uL (ref 4.0–10.5)

## 2021-12-24 LAB — COMPREHENSIVE METABOLIC PANEL
ALT: 21 U/L (ref 0–53)
AST: 19 U/L (ref 0–37)
Albumin: 4.6 g/dL (ref 3.5–5.2)
Alkaline Phosphatase: 62 U/L (ref 39–117)
BUN: 12 mg/dL (ref 6–23)
CO2: 29 mEq/L (ref 19–32)
Calcium: 9.2 mg/dL (ref 8.4–10.5)
Chloride: 108 mEq/L (ref 96–112)
Creatinine, Ser: 0.91 mg/dL (ref 0.40–1.50)
GFR: 102.11 mL/min (ref >60.00)
Glucose, Bld: 91 mg/dL (ref 70–99)
Potassium: 4.2 mEq/L (ref 3.5–5.1)
Sodium: 141 mEq/L (ref 135–145)
Total Bilirubin: 0.4 mg/dL (ref 0.2–1.2)
Total Protein: 6.8 g/dL (ref 6.0–8.3)

## 2021-12-24 LAB — LIPID PANEL
Cholesterol: 125 mg/dL (ref 0–200)
HDL: 44.9 mg/dL (ref >39.00)
LDL Cholesterol: 71 mg/dL (ref 0–99)
NonHDL: 79.7
Total CHOL/HDL Ratio: 3
Triglycerides: 45 mg/dL (ref 0.0–149.0)
VLDL: 9 mg/dL (ref 0.0–40.0)

## 2021-12-24 LAB — HEMOGLOBIN A1C: Hgb A1c MFr Bld: 5.3 % (ref 4.6–6.5)

## 2021-12-24 NOTE — Assessment & Plan Note (Signed)
Hepatitis C antibody will be collected today for screening purposes, further recommendations may be made based upon these results. ?

## 2021-12-24 NOTE — Assessment & Plan Note (Signed)
A1c will be collected today.  Further recommendations may be made based upon these results. ?

## 2021-12-24 NOTE — Assessment & Plan Note (Signed)
Chronic, stable.  BMI today is 27.64.  He is actively working on weight loss and he was encouraged to continue doing so and congratulated on weight loss that he has accomplished thus far.  Blood work will be collected today for further evaluation as well as for screening purposes.  Further recommendations may be made based upon these results. ?

## 2021-12-24 NOTE — Progress Notes (Signed)
? ? ? ?Subjective:  ?Patient ID: Russell Chase, male    DOB: 03-Aug-1976  Age: 46 y.o. MRN: [id number] ? ?CC:  ?Chief Complaint  ?Patient presents with  ? New Patient (Initial Visit)  ?  ? ? ?HPI  ?This patient arrives today for the above. ? ?He is here to establish care, he has no acute concerns.  He reports that he has not seen a primary care doctor in a while and feels that it is time to be " checked out".  Reports a history of heartburn for which she takes omeprazole as needed as well as ADHD but he is not on any medication currently for this. ? ?He also reports he is recently increased his physical activity and has been making changes to his diet in an attempt to lose weight.  He reports he has recently lost about 25 pounds. ? ?Past Medical History:  ?Diagnosis Date  ? Acid reflux   ? ADHD   ? ? ? ? ?Family History  ?Problem Relation Age of Onset  ? Stroke Mother   ? Hearing loss Maternal Grandmother   ? Hearing loss Maternal Grandfather   ? Depression Maternal Grandfather   ? Cancer Maternal Grandfather   ? Colon cancer Maternal Grandfather   ? Diabetes Maternal Grandfather   ? ? ?Social History  ? ?Social History Narrative  ? Not on file  ? ?Social History  ? ?Tobacco Use  ? Smoking status: Never  ? Smokeless tobacco: Not on file  ?Substance Use Topics  ? Alcohol use: Yes  ?  Alcohol/week: 5.0 standard drinks  ?  Types: 5 Cans of beer per week  ? ? ? ?Current Meds  ?Medication Sig  ? omeprazole (PRILOSEC) 20 MG capsule TAKE ONE CAPSULE BY MOUTH DAILY  ? [DISCONTINUED] aspirin 325 MG tablet Take 650 mg by mouth once. For headache  ? ? ?ROS:  ?Review of Systems  ?Constitutional:  Negative for fever, malaise/fatigue and weight loss.  ?Respiratory:  Negative for cough and shortness of breath.   ?Cardiovascular:  Negative for chest pain.  ?Gastrointestinal:  Positive for heartburn. Negative for abdominal pain and blood in stool.  ?Neurological:  Negative for seizures, loss of consciousness and headaches.   ?Psychiatric/Behavioral:  Negative for depression and suicidal ideas.   ? ? ?Objective:  ? ?Today's Vitals: BP 112/82   Pulse 95   Temp 98 ?F (36.7 ?C) (Oral)   Ht 6\' 2"  (1.88 m)   Wt 215 lb 4 oz (97.6 kg)   SpO2 93%   BMI 27.64 kg/m?  ? ?  12/24/2021  ?  8:44 AM 09/22/2014  ?  3:57 PM 09/22/2014  ?  3:48 PM  ?Vitals with BMI  ?Height 6\' 2"     ?Weight 215 lbs 4 oz    ?BMI 27.62    ?Systolic [id number] Q000111Q Q000111Q  ?Diastolic 82 81 78  ?Pulse 95 61 65  ?  ? ?Physical Exam ?Vitals reviewed.  ?Constitutional:   ?   Appearance: Normal appearance.  ?HENT:  ?   Head: Normocephalic and atraumatic.  ?Cardiovascular:  ?   Rate and Rhythm: Normal rate and regular rhythm.  ?Pulmonary:  ?   Effort: Pulmonary effort is normal.  ?   Breath sounds: Normal breath sounds.  ?Musculoskeletal:  ?   Cervical back: Neck supple.  ?Skin: ?   General: Skin is warm and dry.  ?Neurological:  ?   Mental Status: He is alert and oriented to person,  place, and time.  ?Psychiatric:     ?   Mood and Affect: Mood normal.     ?   Behavior: Behavior normal.     ?   Thought Content: Thought content normal.     ?   Judgment: Judgment normal.  ? ? ? ? ? ? ? ?Assessment and Plan  ? ?1. Overweight   ?2. Screening for diabetes mellitus   ?3. Screening for cholesterol level   ?4. Need for hepatitis C screening test   ? ? ? ?Plan: ?See plan via problem list below. ? ? ? ?Tests ordered ?Orders Placed This Encounter  ?Procedures  ? Hemoglobin A1c  ? Lipid panel  ? Comprehensive metabolic panel  ? CBC  ? Hepatitis C Antibody  ? ? ? ? ?No orders of the defined types were placed in this encounter. ? ? ?Patient to follow-up in 1 to 3 months for annual physical exam, or sooner as needed. ? ?Ailene Ards, NP ? ?

## 2021-12-24 NOTE — Assessment & Plan Note (Signed)
We will collect lipid panel for further evaluation today.  Further recommendations may be made based upon these results. ?

## 2021-12-27 LAB — HEPATITIS C ANTIBODY
Hepatitis C Ab: NONREACTIVE
SIGNAL TO CUT-OFF: 0.06 (ref <1.00)

## 2022-01-20 ENCOUNTER — Encounter: Payer: 59 | Admitting: Nurse Practitioner

## 2022-02-11 ENCOUNTER — Ambulatory Visit (INDEPENDENT_AMBULATORY_CARE_PROVIDER_SITE_OTHER): Payer: Commercial Managed Care - PPO | Admitting: Nurse Practitioner

## 2022-02-11 ENCOUNTER — Encounter: Payer: Self-pay | Admitting: Nurse Practitioner

## 2022-02-11 VITALS — BP 100/70 | HR 77 | Temp 98.0°F | Ht 74.0 in | Wt 214.6 lb

## 2022-02-11 DIAGNOSIS — K219 Gastro-esophageal reflux disease without esophagitis: Secondary | ICD-10-CM | POA: Diagnosis not present

## 2022-02-11 DIAGNOSIS — R5383 Other fatigue: Secondary | ICD-10-CM | POA: Diagnosis not present

## 2022-02-11 DIAGNOSIS — R42 Dizziness and giddiness: Secondary | ICD-10-CM | POA: Insufficient documentation

## 2022-02-11 DIAGNOSIS — Z0001 Encounter for general adult medical examination with abnormal findings: Secondary | ICD-10-CM | POA: Diagnosis not present

## 2022-02-11 MED ORDER — OMEPRAZOLE 20 MG PO CPDR: 20.0000 mg | DELAYED_RELEASE_CAPSULE | Freq: Every day | ORAL | 3 refills | Status: DC | PRN

## 2022-02-11 NOTE — Assessment & Plan Note (Signed)
Overall exam grossly normal.  Encouraged him to continue living a healthy lifestyle by following a healthy diet and participating in moderate intensity physical activity for at least 150 minutes/week. ?

## 2022-02-11 NOTE — Assessment & Plan Note (Signed)
Chronic, mild, stable.  Seems to be elicited by high carb meals.  Considered checking TSH, per shared decision making patient feels fatigue is not significant enough for further evaluation.  May consider this in the future fatigue worsens.  CBC within normal limits, metabolic panel with normal lites, A1c normal, for now patient will monitor symptoms and let me know if symptoms worsen. ?

## 2022-02-11 NOTE — Assessment & Plan Note (Signed)
Mild, spontaneously resolved within a few minutes.  Orthostatic vital signs negative.  Blood work at last office visit within normal limits.  For now recommend he continue to monitor, if he experiences any chest pain, worsening fatigue, shortness of breath, or diaphoresis he was told to notify me.  He reports his understanding. ?

## 2022-02-11 NOTE — Assessment & Plan Note (Signed)
Stable, controlled on as needed medications. Recommended consider changing to Prilosec as needed. Patient would prefer to continue omeprazole as needed.  He is encouraged to let me know if he starts needed take this on a daily basis for maintenance of GERD.  He reports his understanding. ?

## 2022-02-11 NOTE — Patient Instructions (Signed)

## 2022-02-11 NOTE — Progress Notes (Addendum)
? ? ? ?Subjective:  ?Patient ID: Russell Chase, male    DOB: September 10, 1976  Age: 46 y.o. MRN: [id number] ? ?CC:  ?Chief Complaint  ?Patient presents with  ? Annual Exam  ?  ? ? ?HPI  ?This patient arrives today for the above. ? ?At last office visit blood work was collected which showed normal metabolic panel, normal lipid panel, normal CBC, normal A1c, and nonreactive hepatitis C antibody. ? ?He has intermittent heartburn and takes omeprazole as needed.  He is requesting refill on this today.  He has undergone endoscopy and colonoscopy in the past for evaluation with GI.  Both of which were normal. ? ?He reports intermittent dizziness that spontaneously resolved within a few minutes after changing position, mostly from sitting to standing.  He also reports fatigue after eating a high carb meal.  Otherwise patient is feeling well. ? ?Past Medical History:  ?Diagnosis Date  ? Acid reflux   ? ADHD   ? ? ? ? ?Family History  ?Problem Relation Age of Onset  ? Stroke Mother   ? Hearing loss Maternal Grandmother   ? Hearing loss Maternal Grandfather   ? Depression Maternal Grandfather   ? Cancer Maternal Grandfather   ? Colon cancer Maternal Grandfather   ? Diabetes Maternal Grandfather   ? ? ?Social History  ? ?Social History Narrative  ? Not on file  ? ?Social History  ? ?Tobacco Use  ? Smoking status: Never  ? Smokeless tobacco: Not on file  ?Substance Use Topics  ? Alcohol use: Yes  ?  Alcohol/week: 5.0 standard drinks  ?  Types: 5 Cans of beer per week  ? ? ? ?Current Meds  ?Medication Sig  ? [DISCONTINUED] omeprazole (PRILOSEC) 20 MG capsule TAKE ONE CAPSULE BY MOUTH DAILY  ? ? ?ROS:  ?Review of Systems  ?Constitutional:  Positive for malaise/fatigue (with high carb intake). Negative for fever and weight loss.  ?Respiratory:  Negative for cough and shortness of breath.   ?Cardiovascular:  Negative for chest pain.  ?Gastrointestinal:  Positive for heartburn (only with specific intake of food/drink). Negative  for abdominal pain and blood in stool.  ?Neurological:  Negative for dizziness and headaches.  ?Psychiatric/Behavioral:  Negative for depression and suicidal ideas.   ? ? ?Objective:  ? ?Today's Vitals: BP 100/70 (BP Location: Left Arm, Patient Position: Sitting, Cuff Size: Normal)   Pulse 77   Temp 98 ?F (36.7 ?C) (Oral)   Ht 6\' 2"  (1.88 m)   Wt 214 lb 9.6 oz (97.3 kg)   SpO2 98%   BMI 27.55 kg/m?  ? ?  02/11/2022  ? 10:53 AM 12/24/2021  ?  8:44 AM 09/22/2014  ?  3:57 PM  ?Vitals with BMI  ?Height 6\' 2"  6\' 2"    ?Weight 214 lbs 10 oz 215 lbs 4 oz   ?BMI 27.54 27.62   ?Systolic [id number] Q000111Q  ?Diastolic 70 82 81  ?Pulse 77 95 61  ?  ? ?  02/11/2022  ? 10:52 AM 12/24/2021  ?  8:45 AM  ?PHQ9 SCORE ONLY  ?PHQ-9 Total Score 0 0  ? ? ? ?Physical Exam ?Vitals reviewed.  ?Constitutional:   ?   General: He is not in acute distress. ?   Appearance: Normal appearance. He is not ill-appearing.  ?HENT:  ?   Head: Normocephalic and atraumatic.  ?   Right Ear: Tympanic membrane, ear canal and external ear normal.  ?   Left Ear: Ear canal  and external ear normal. Tympanic membrane is scarred (hx of ear tubes).  ?Eyes:  ?   General: No scleral icterus. ?   Extraocular Movements: Extraocular movements intact.  ?   Conjunctiva/sclera: Conjunctivae normal.  ?   Pupils: Pupils are equal, round, and reactive to light.  ?Neck:  ?   Vascular: No carotid bruit.  ?Cardiovascular:  ?   Rate and Rhythm: Normal rate and regular rhythm.  ?   Pulses: Normal pulses.  ?   Heart sounds: Normal heart sounds.  ?Pulmonary:  ?   Effort: Pulmonary effort is normal.  ?   Breath sounds: Normal breath sounds.  ?Abdominal:  ?   General: Bowel sounds are normal. There is no distension.  ?   Palpations: There is no mass.  ?   Tenderness: There is no abdominal tenderness.  ?   Hernia: No hernia is present.  ?Musculoskeletal:     ?   General: No swelling or tenderness.  ?   Cervical back: Normal range of motion and neck supple. No rigidity.  ?Lymphadenopathy:  ?    Cervical: No cervical adenopathy.  ?Skin: ?   General: Skin is warm and dry.  ?Neurological:  ?   General: No focal deficit present.  ?   Mental Status: He is alert and oriented to person, place, and time.  ?   Cranial Nerves: No cranial nerve deficit.  ?   Sensory: No sensory deficit.  ?   Motor: No weakness.  ?   Gait: Gait normal.  ?Psychiatric:     ?   Mood and Affect: Mood normal.     ?   Behavior: Behavior normal.     ?   Judgment: Judgment normal.  ? ? ? ?Orthostatic vital signs: ? ?Laying: 118/80, 69 ?Standing: 120/83, 78 ? ? ? ?Assessment and Plan  ? ?1. Gastroesophageal reflux disease, unspecified whether esophagitis present   ?2. Encounter for general adult medical examination with abnormal findings   ?3. Dizziness   ?4. Fatigue, unspecified type   ? ? ? ?Plan: ?See plan via problem list below. ? ? ?Tests ordered ?No orders of the defined types were placed in this encounter. ? ? ? ? ?Meds ordered this encounter  ?Medications  ? omeprazole (PRILOSEC) 20 MG capsule  ?  Sig: Take 1 capsule (20 mg total) by mouth daily as needed.  ?  Dispense:  30 capsule  ?  Refill:  3  ?  Order Specific Question:   Supervising Provider  ?  Answer:   Binnie Rail [id number]  ? ? ?Patient to follow-up in 1 year or sooner as needed ? ?Ailene Ards, NP ? ?

## 2023-02-16 ENCOUNTER — Encounter: Payer: 59 | Admitting: Nurse Practitioner

## 2023-03-09 ENCOUNTER — Ambulatory Visit (INDEPENDENT_AMBULATORY_CARE_PROVIDER_SITE_OTHER): Payer: Commercial Managed Care - PPO | Admitting: Nurse Practitioner

## 2023-03-09 VITALS — BP 116/84 | HR 67 | Temp 98.0°F | Ht 74.0 in | Wt 233.5 lb

## 2023-03-09 DIAGNOSIS — Z0000 Encounter for general adult medical examination without abnormal findings: Secondary | ICD-10-CM | POA: Diagnosis not present

## 2023-03-09 DIAGNOSIS — Z0001 Encounter for general adult medical examination with abnormal findings: Secondary | ICD-10-CM

## 2023-03-09 DIAGNOSIS — E663 Overweight: Secondary | ICD-10-CM | POA: Diagnosis not present

## 2023-03-09 DIAGNOSIS — K219 Gastro-esophageal reflux disease without esophagitis: Secondary | ICD-10-CM

## 2023-03-09 LAB — CBC
HCT: 44.5 % (ref 39.0–52.0)
Hemoglobin: 14.7 g/dL (ref 13.0–17.0)
MCHC: 33 g/dL (ref 30.0–36.0)
MCV: 93.9 fl (ref 78.0–100.0)
Platelets: 251 10*3/uL (ref 150.0–400.0)
RBC: 4.74 Mil/uL (ref 4.22–5.81)
RDW: 12.7 % (ref 11.5–15.5)
WBC: 4.9 10*3/uL (ref 4.0–10.5)

## 2023-03-09 LAB — LIPID PANEL
Cholesterol: 165 mg/dL (ref 0–200)
HDL: 45.9 mg/dL (ref >39.00)
LDL Cholesterol: 108 mg/dL — ABNORMAL HIGH (ref 0–99)
NonHDL: 118.87
Total CHOL/HDL Ratio: 4
Triglycerides: 55 mg/dL (ref 0.0–149.0)
VLDL: 11 mg/dL (ref 0.0–40.0)

## 2023-03-09 LAB — COMPREHENSIVE METABOLIC PANEL
ALT: 32 U/L (ref 0–53)
AST: 19 U/L (ref 0–37)
Albumin: 4.5 g/dL (ref 3.5–5.2)
Alkaline Phosphatase: 69 U/L (ref 39–117)
BUN: 14 mg/dL (ref 6–23)
CO2: 29 mEq/L (ref 19–32)
Calcium: 9.4 mg/dL (ref 8.4–10.5)
Chloride: 103 mEq/L (ref 96–112)
Creatinine, Ser: 0.91 mg/dL (ref 0.40–1.50)
GFR: 101.26 mL/min (ref >60.00)
Glucose, Bld: 95 mg/dL (ref 70–99)
Potassium: 4.1 mEq/L (ref 3.5–5.1)
Sodium: 138 mEq/L (ref 135–145)
Total Bilirubin: 0.5 mg/dL (ref 0.2–1.2)
Total Protein: 7.2 g/dL (ref 6.0–8.3)

## 2023-03-09 LAB — HEMOGLOBIN A1C: Hgb A1c MFr Bld: 5.5 % (ref 4.6–6.5)

## 2023-03-09 LAB — TSH: TSH: 1.39 u[IU]/mL (ref 0.35–5.50)

## 2023-03-09 NOTE — Progress Notes (Signed)
Complete physical exam  Patient: Russell Chase   DOB: 1976-07-20   47 y.o. Male  MRN: [id number]  Subjective:    Chief Complaint  Patient presents with   Annual Exam    Russell Chase is a 47 y.o. male who presents today for a complete physical exam. He reports consuming a low fat and low sodium diet. Home exercise routine includes walking 90 hrs per day. Gym/ health club routine includes cardio, light weights, mod to heavy weightlifting, and stationary bike. He generally feels well. He reports sleeping poorly. He does not have additional problems to discuss today.    Most recent fall risk assessment:    03/09/2023    8:54 AM  Fall Risk   Falls in the past year? 0  Number falls in past yr: 0  Injury with Fall? 0  Risk for fall due to : No Fall Risks  Follow up Falls evaluation completed     Most recent depression screenings:    03/09/2023    8:54 AM 02/11/2022   10:52 AM  PHQ 2/9 Scores  PHQ - 2 Score 0 0    Vision:Not within last year  and Dental: No current dental problems and Receives regular dental care - scheduled to see eye doctor next month  Past Medical History:  Diagnosis Date   Acid reflux    ADHD    Past Surgical History:  Procedure Laterality Date   KNEE SURGERY     Right   KNEE SURGERY     Family History  Problem Relation Age of Onset   Stroke Mother    Hearing loss Maternal Grandmother    Hearing loss Maternal Grandfather    Depression Maternal Grandfather    Cancer Maternal Grandfather    Colon cancer Maternal Grandfather    Diabetes Maternal Grandfather       Patient Care Team: Elenore Paddy, NP as PCP - General (Nurse Practitioner)   Outpatient Medications Prior to Visit  Medication Sig   omeprazole (PRILOSEC) 20 MG capsule Take 1 capsule (20 mg total) by mouth daily as needed.   [DISCONTINUED] ibuprofen (ADVIL) 800 MG tablet ibuprofen 800 mg tablet  TAKE 1 TABLET 3 TIMES A DAY AS NEEDED FOR PAIN   No  facility-administered medications prior to visit.    Review of Systems  Constitutional:  Positive for malaise/fatigue (chronic, intermittent). Negative for chills, fever and weight loss.  HENT:  Negative for congestion, hearing loss, sinus pain and tinnitus.   Eyes:  Negative for blurred vision and double vision.  Respiratory:  Negative for cough, shortness of breath and wheezing.   Cardiovascular:  Negative for chest pain and palpitations.  Gastrointestinal:  Negative for abdominal pain, blood in stool, constipation, diarrhea, nausea and vomiting.  Genitourinary:  Negative for dysuria and hematuria.  Skin:  Negative for itching and rash.  Neurological:  Negative for dizziness, seizures, loss of consciousness and headaches.  Psychiatric/Behavioral:  Negative for depression and suicidal ideas. The patient has insomnia.           Objective:     BP 116/84   Pulse 67   Temp 98 F (36.7 C) (Temporal)   Ht 6\' 2"  (1.88 m)   Wt 233 lb 8 oz (105.9 kg)   SpO2 97%   BMI 29.98 kg/m  BP Readings from Last 3 Encounters:  03/09/23 116/84  02/11/22 100/70  12/24/21 112/82   Wt Readings from Last 3 Encounters:  03/09/23 233 lb  8 oz (105.9 kg)  02/11/22 214 lb 9.6 oz (97.3 kg)  12/24/21 215 lb 4 oz (97.6 kg)      03/09/2023    8:54 AM 02/11/2022   10:52 AM 12/24/2021    8:45 AM  PHQ9 SCORE ONLY  PHQ-9 Total Score 0 0 0      Physical Exam Vitals reviewed.  Constitutional:      General: He is not in acute distress.    Appearance: Normal appearance. He is not ill-appearing.  HENT:     Head: Normocephalic and atraumatic.     Right Ear: Tympanic membrane, ear canal and external ear normal.     Left Ear: Tympanic membrane, ear canal and external ear normal.  Eyes:     General: No scleral icterus.    Extraocular Movements: Extraocular movements intact.     Conjunctiva/sclera: Conjunctivae normal.     Pupils: Pupils are equal, round, and reactive to light.  Neck:     Vascular: No  carotid bruit.  Cardiovascular:     Rate and Rhythm: Normal rate and regular rhythm.     Pulses: Normal pulses.     Heart sounds: Normal heart sounds.  Pulmonary:     Effort: Pulmonary effort is normal.     Breath sounds: Normal breath sounds.  Abdominal:     General: Bowel sounds are normal. There is no distension.     Palpations: There is no mass.     Tenderness: There is no abdominal tenderness.     Hernia: No hernia is present.  Musculoskeletal:        General: No swelling or tenderness.     Cervical back: Normal range of motion and neck supple. No rigidity.  Lymphadenopathy:     Cervical: No cervical adenopathy.  Skin:    General: Skin is warm and dry.  Neurological:     General: No focal deficit present.     Mental Status: He is alert and oriented to person, place, and time.     Cranial Nerves: No cranial nerve deficit.     Sensory: No sensory deficit.     Motor: No weakness.     Gait: Gait normal.  Psychiatric:        Mood and Affect: Mood normal.        Behavior: Behavior normal.        Judgment: Judgment normal.      No results found for any visits on 03/09/23.     Assessment & Plan:    Routine Health Maintenance and Physical Exam  Immunization History  Administered Date(s) Administered   Tdap 10/03/2013    Health Maintenance  Topic Date Due   HIV Screening  03/08/2024 (Originally 12/06/1991)   INFLUENZA VACCINE  05/04/2023   DTaP/Tdap/Td (2 - Td or Tdap) 10/04/2023   Colonoscopy  09/22/2024   Hepatitis C Screening  Completed   HPV VACCINES  Aged Out   COVID-19 Vaccine  Discontinued    Discussed health benefits of physical activity, and encouraged him to engage in regular exercise appropriate for his age and condition.  Problem List Items Addressed This Visit       Digestive   Gastroesophageal reflux disease    Chronic, controlled Takes omeprazole daily as needed.      Relevant Orders   CBC   Comprehensive metabolic panel   Hemoglobin A1c    Lipid panel   TSH     Other   Overweight    Chronic Encouraged to focus  on healthy diet as well as exercise 150 minutes/week.  We discussed goal of around 5% weight loss. Labs ordered, further recommendations to be made based upon these results.      Relevant Orders   CBC   Comprehensive metabolic panel   Hemoglobin A1c   Lipid panel   TSH   Encounter for general adult medical examination with abnormal findings - Primary    Overall exam within normal limits. Patient elected to defer HIV screening as well as COVID-19 immunization.  We did discuss that he will be due for Tdap in 2025 and he reports that after last colonoscopy was told to rescreen at age 41.  He was encouraged to follow healthy lifestyle recommendations including healthy diet, 150 minutes of moderate intensity exercise per week, and to use sunscreen while in the sun.  He reports his understanding.      Relevant Orders   CBC   Comprehensive metabolic panel   Hemoglobin A1c   Lipid panel   TSH   Return in about 1 year (around 03/08/2024) for CPE with [facility].     Elenore Paddy, NP

## 2023-03-09 NOTE — Assessment & Plan Note (Signed)
Overall exam within normal limits. Patient elected to defer HIV screening as well as COVID-19 immunization.  We did discuss that he will be due for Tdap in 2025 and he reports that after last colonoscopy was told to rescreen at age 47.  He was encouraged to follow healthy lifestyle recommendations including healthy diet, 150 minutes of moderate intensity exercise per week, and to use sunscreen while in the sun.  He reports his understanding.

## 2023-03-09 NOTE — Assessment & Plan Note (Signed)
Chronic Encouraged to focus on healthy diet as well as exercise 150 minutes/week.  We discussed goal of around 5% weight loss. Labs ordered, further recommendations to be made based upon these results.

## 2023-03-09 NOTE — Assessment & Plan Note (Signed)
Chronic, controlled Takes omeprazole daily as needed.

## 2023-05-24 ENCOUNTER — Encounter: Payer: Self-pay | Admitting: Nurse Practitioner

## 2023-05-26 ENCOUNTER — Telehealth (INDEPENDENT_AMBULATORY_CARE_PROVIDER_SITE_OTHER): Payer: Commercial Managed Care - PPO | Admitting: Nurse Practitioner

## 2023-05-26 DIAGNOSIS — R41840 Attention and concentration deficit: Secondary | ICD-10-CM

## 2023-05-26 NOTE — Assessment & Plan Note (Signed)
Chronic, seems to be getting worse PHQ-9: 4, GAD-7: 12 Discussed trial of Wellbutrin versus referral to [facility] for evaluation and management. Patient would prefer to be evaluated by [facility] Referral ordered today.

## 2023-05-26 NOTE — Progress Notes (Signed)
   Established Patient Office Visit  An audio/visual tele-health visit was completed today for this patient. I connected with  Russell Chase on 05/26/23 utilizing audio/visual technology and verified that I am speaking with the correct person using two identifiers. The patient was located at their home, and I was located at the office of [facility] during the encounter. I discussed the limitations of evaluation and management by telemedicine. The patient expressed understanding and agreed to proceed.    Subjective   Patient ID: Russell Chase, male    DOB: [birth year]  Age: 47 y.o. MRN: [id number]  Chief Complaint  Patient presents with   ADHD   Patient reports he was diagnosed with ADHD in childhood.  He has been notified by colleagues that he seems to be more distracted and anxious lately.  He reports having been on medication in the past but not since childhood and would like to discuss evaluation and possible treatment.  Denies suicidal ideation today.     ROS: see HPI    Objective:     There were no vitals taken for this visit.      05/26/2023    3:21 PM 03/09/2023    8:54 AM 02/11/2022   10:52 AM  PHQ9 SCORE ONLY  PHQ-9 Total Score 4 0 0      05/26/2023    3:24 PM  GAD 7 : Generalized Anxiety Score  Nervous, Anxious, on Edge 2  Control/stop worrying 1  Worry too much - different things 2  Trouble relaxing 3  Restless 3  Easily annoyed or irritable 1  Afraid - awful might happen 0  Total GAD 7 Score 12     Physical Exam Comprehensive physical exam not completed today as office visit was conducted remotely.  Patient appears well over video.  Patient was alert and oriented, and appeared to have appropriate judgment.   No results found for any visits on 05/26/23.    The 10-year ASCVD risk score (Arnett DK, et al., 2019) is: 1.5%    Assessment & Plan:   Problem List Items Addressed This Visit       Other   Attention  deficit - Primary    Chronic, seems to be getting worse PHQ-9: 4, GAD-7: 12 Discussed trial of Wellbutrin versus referral to [facility] for evaluation and management. Patient would prefer to be evaluated by [facility] Referral ordered today.      Relevant Orders   Ambulatory referral to Psychiatry    No follow-ups on file.    Elenore Paddy, NP

## 2024-02-27 ENCOUNTER — Other Ambulatory Visit: Payer: Self-pay | Admitting: Nurse Practitioner

## 2024-02-27 DIAGNOSIS — K219 Gastro-esophageal reflux disease without esophagitis: Secondary | ICD-10-CM

## 2024-02-27 NOTE — Telephone Encounter (Unsigned)
 Copied from CRM [id number]. Topic: Clinical - Medication Refill >> Feb 27, 2024  2:56 PM [place] A wrote: Medication: omeprazole  (PRILOSEC) 20 MG capsule  Has the patient contacted their pharmacy? No (Agent: If no, request that the patient contact the pharmacy for the refill. If patient does not wish to contact the pharmacy document the reason why and proceed with request.) (Agent: If yes, when and what did the pharmacy advise?)  This is the patient's preferred pharmacy:  [id number] - [place], [place] - [address] AT [facility] [address] [place] [address] Phone: [phone] Fax: [phone]  Is this the correct pharmacy for this prescription? Yes If no, delete pharmacy and type the correct one.   Has the prescription been filled recently? No  Is the patient out of the medication? Yes  Has the patient been seen for an appointment in the last year OR does the patient have an upcoming appointment? Yes  Can we respond through MyChart? Yes  Agent: Please be advised that Rx refills may take up to 3 business days. We ask that you follow-up with your pharmacy.

## 2024-02-28 MED ORDER — OMEPRAZOLE 20 MG PO CPDR
20.0000 mg | DELAYED_RELEASE_CAPSULE | Freq: Every day | ORAL | 3 refills | Status: DC | PRN
Start: 2024-02-28 — End: 2024-06-06

## 2024-03-14 ENCOUNTER — Encounter: Payer: Commercial Managed Care - PPO | Admitting: Nurse Practitioner

## 2024-04-26 ENCOUNTER — Encounter: Admitting: Nurse Practitioner

## 2024-06-06 ENCOUNTER — Ambulatory Visit: Admitting: Nurse Practitioner

## 2024-06-06 VITALS — BP 134/86 | HR 75 | Temp 98.0°F | Ht 74.0 in | Wt 231.4 lb

## 2024-06-06 DIAGNOSIS — K219 Gastro-esophageal reflux disease without esophagitis: Secondary | ICD-10-CM

## 2024-06-06 DIAGNOSIS — Z0001 Encounter for general adult medical examination with abnormal findings: Secondary | ICD-10-CM

## 2024-06-06 DIAGNOSIS — E663 Overweight: Secondary | ICD-10-CM | POA: Diagnosis not present

## 2024-06-06 DIAGNOSIS — Z0000 Encounter for general adult medical examination without abnormal findings: Secondary | ICD-10-CM | POA: Diagnosis not present

## 2024-06-06 MED ORDER — OMEPRAZOLE 20 MG PO CPDR
20.0000 mg | DELAYED_RELEASE_CAPSULE | Freq: Every day | ORAL | 3 refills | Status: AC | PRN
Start: 2024-06-06 — End: ?

## 2024-06-06 NOTE — Assessment & Plan Note (Signed)
 General adult medical examination Routine examination with no new issues. Mood stable, reports light sleep. - Order fasting labs including cholesterol within two weeks. - Advise lab visit as walk-in starting at 8 AM, fasting for at least 8 hours. -Discussed recommended screenings and immunizations.  Patient refuses flu shot today. - Schedule follow-up physical examination for June 12, 2025.

## 2024-06-06 NOTE — Assessment & Plan Note (Signed)
 Current BMI 29.71. Labs ordered, for recommendations may be made based upon these results

## 2024-06-06 NOTE — Assessment & Plan Note (Signed)
 Gastroesophageal reflux disease (GERD) GERD well-controlled with omeprazole  as needed based on dietary triggers. - Send [facility] for [facility]ep[facility]ole , [facility] [address]

## 2024-06-06 NOTE — Progress Notes (Signed)
 Complete physical exam  Patient: Russell Chase   DOB: [birth year]   48 y.o. Male  MRN: [id number]  Subjective:    No chief complaint on file.   JOEPH SZATKOWSKI Chase is a 48 y.o. male who presents today for a complete physical exam.  Discussed the use of AI scribe software for clinical note transcription with the patient, who gave verbal consent to proceed.  History of Present Illness Russell Chase is a 48 year old male who presents for an annual physical exam.  Gastroesophageal reflux symptoms - Manages acid reflux with omeprazole , taken every other day or as needed, particularly after alcohol or pizza consumption - No stomach pain or blood in stool      Most recent fall risk assessment:    06/06/2024    1:15 PM  Fall Risk   Falls in the past year? 0  Number falls in past yr: 0  Injury with Fall? 0  Risk for fall due to : No Fall Risks  Follow up Falls evaluation completed     Most recent depression screenings:    06/06/2024    1:15 PM 05/26/2023    3:21 PM  PHQ 2/9 Scores  PHQ - 2 Score 0 0  PHQ- 9 Score  4      Past Medical History:  Diagnosis Date   Acid reflux    ADHD    Past Surgical History:  Procedure Laterality Date   KNEE SURGERY     Right   KNEE SURGERY     Social History   Tobacco Use   Smoking status: Never  Substance Use Topics   Alcohol use: Yes    Alcohol/week: 5.0 standard drinks of alcohol    Types: 5 Cans of beer per week   Drug use: No   Family History  Problem Relation Age of Onset   Stroke Mother    Hearing loss Maternal Grandmother    Hearing loss Maternal Grandfather    Depression Maternal Grandfather    Cancer Maternal Grandfather    Colon cancer Maternal Grandfather    Diabetes Maternal Grandfather    No Known Allergies    Patient Care Team: Elnor Lauraine BRAVO, NP as PCP - General (Nurse Practitioner)   Outpatient Medications Prior to Visit  Medication Sig   [DISCONTINUED] omeprazole   (PRILOSEC) 20 MG capsule Take 1 capsule (20 mg total) by mouth daily as needed.   No facility-administered medications prior to visit.    Review of Systems  Constitutional:  Negative for fever and weight loss.  Respiratory:  Negative for cough and shortness of breath.   Cardiovascular:  Negative for chest pain and palpitations.  Gastrointestinal:  Negative for abdominal pain, blood in stool and heartburn.  Genitourinary:  Negative for dysuria and hematuria.  Neurological:  Negative for dizziness and headaches.  Psychiatric/Behavioral:  Negative for depression. The patient has insomnia.           Objective:     BP 134/86   Pulse 75   Temp 98 F (36.7 C) (Temporal)   Ht 6' 2 (1.88 m)   Wt 231 lb 6 oz (105 kg)   SpO2 94%   BMI 29.71 kg/m  BP Readings from Last 3 Encounters:  06/06/24 134/86  03/09/23 116/84  02/11/22 100/70   Wt Readings from Last 3 Encounters:  06/06/24 231 lb 6 oz (105 kg)  03/09/23 233 lb 8 oz (105.9 kg)  02/11/22 214 lb 9.6 oz (  97.3 kg)      Physical Exam Vitals reviewed.  Constitutional:      General: He is not in acute distress.    Appearance: Normal appearance. He is not ill-appearing.  HENT:     Head: Normocephalic and atraumatic.     Right Ear: Tympanic membrane, ear canal and external ear normal.     Left Ear: Tympanic membrane, ear canal and external ear normal.  Eyes:     General: No scleral icterus.    Extraocular Movements: Extraocular movements intact.     Conjunctiva/sclera: Conjunctivae normal.     Pupils: Pupils are equal, round, and reactive to light.  Neck:     Vascular: No carotid bruit.  Cardiovascular:     Rate and Rhythm: Normal rate and regular rhythm.     Pulses: Normal pulses.     Heart sounds: Normal heart sounds.  Pulmonary:     Effort: Pulmonary effort is normal.     Breath sounds: Normal breath sounds.  Abdominal:     General: Bowel sounds are normal. There is no distension.     Palpations: There is no  mass.     Tenderness: There is no abdominal tenderness.     Hernia: No hernia is present.  Musculoskeletal:        General: No swelling or tenderness.     Cervical back: Normal range of motion and neck supple. No rigidity.  Lymphadenopathy:     Cervical: No cervical adenopathy.  Skin:    General: Skin is warm and dry.  Neurological:     General: No focal deficit present.     Mental Status: He is alert and oriented to person, place, and time.     Cranial Nerves: No cranial nerve deficit.     Sensory: No sensory deficit.     Motor: No weakness.     Gait: Gait normal.  Psychiatric:        Mood and Affect: Mood normal.        Behavior: Behavior normal.        Judgment: Judgment normal.      No results found for any visits on 06/06/24.     Assessment & Plan:    Routine Health Maintenance and Physical Exam  Immunization History  Administered Date(s) Administered   Tdap 10/03/2013    Health Maintenance  Topic Date Due   HIV Screening  Never done   Hepatitis B Vaccines 19-59 Average Risk (1 of 3 - 19+ 3-dose series) Never done   DTaP/Tdap/Td (2 - Td or Tdap) 10/04/2023   Colonoscopy  09/22/2024   INFLUENZA VACCINE  12/31/2024 (Originally 05/03/2024)   Hepatitis C Screening  Completed   Pneumococcal Vaccine  Aged Out   HPV VACCINES  Aged Out   Meningococcal B Vaccine  Aged Out   COVID-19 Vaccine  Discontinued    Discussed health benefits of physical activity, and encouraged him to engage in regular exercise appropriate for his age and condition.  Problem List Items Addressed This Visit       Digestive   Gastroesophageal reflux disease   Gastroesophageal reflux disease (GERD) GERD well-controlled with omeprazole  as needed based on dietary triggers. - [facility] , [facility] [address]      Relevant Medications   omeprazole  (PRILOSEC) 20 MG capsule   Other Relevant Orders   CBC   Comprehensive metabolic panel with GFR    Hemoglobin A1c   Lipid panel  TSH     Other   Overweight   Current BMI 29.71. Labs ordered, for recommendations may be made based upon these results      Relevant Orders   CBC   Comprehensive metabolic panel with GFR   Hemoglobin A1c   Lipid panel   TSH   Encounter for general adult medical examination with abnormal findings - Primary   General adult medical examination Routine examination with no new issues. Mood stable, reports light sleep. - Order fasting labs including cholesterol within two weeks. - Advise lab visit as walk-in starting at 8 AM, fasting for at least 8 hours. -Discussed recommended screenings and immunizations.  Patient refuses flu shot today. - Schedule follow-up physical examination for June 12, 2025.      Relevant Orders   CBC   Comprehensive metabolic panel with GFR   Hemoglobin A1c   Lipid panel   TSH   Assessment and Plan Assessment & Plan General adult medical examination Routine examination with no new issues. Mood stable, reports light sleep. - Order fasting labs including cholesterol within two weeks. - Advise lab visit as walk-in starting at 8 AM, fasting for at least 8 hours. -Discussed recommended screenings and immunizations.  Patient refuses flu shot today. - Schedule follow-up physical examination for June 12, 2025.  Gastroesophageal reflux disease (GERD) GERD well-controlled with omeprazole  as needed based on dietary triggers. - [facility] , [facility] [address]    Return in about 1 year (around 06/06/2025) for CPE with Anastasiya Gowin.  In addition to performing a physical exam multiple for an office visit as detailed above    Lauraine FORBES Pereyra, NP

## 2025-06-12 ENCOUNTER — Encounter: Admitting: Nurse Practitioner
